# Patient Record
Sex: Male | Born: 1995 | Race: White | Hispanic: No | Marital: Single | Smoking: Never smoker
Health system: Southern US, Community
[De-identification: ages and names within clinical notes are randomized; demographics above are authoritative.]

## PROBLEM LIST (undated history)

## (undated) DIAGNOSIS — I341 Nonrheumatic mitral (valve) prolapse: Secondary | ICD-10-CM

## (undated) DIAGNOSIS — I340 Nonrheumatic mitral (valve) insufficiency: Secondary | ICD-10-CM

---
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
## 2015-02-15 ENCOUNTER — Emergency Department: Admit: 2015-02-15 | Discharge status: Against Medical Advice | Payer: Self-pay | Admitting: Emergency Medicine

## 2016-04-28 ENCOUNTER — Emergency Department
Admission: EM | Admit: 2016-04-28 | Discharge: 2016-04-28 | Disposition: A | Discharge status: Home / Self Care | Payer: Managed Care, Other (non HMO) | Attending: Emergency Medicine | Admitting: Emergency Medicine

## 2016-04-28 ENCOUNTER — Emergency Department: Payer: Managed Care, Other (non HMO)

## 2016-04-28 ENCOUNTER — Encounter: Payer: Self-pay | Admitting: Emergency Medicine

## 2016-04-28 DIAGNOSIS — R1084 Generalized abdominal pain: Secondary | ICD-10-CM | POA: Diagnosis present

## 2016-04-28 DIAGNOSIS — R1110 Vomiting, unspecified: Secondary | ICD-10-CM | POA: Insufficient documentation

## 2016-04-28 DIAGNOSIS — R197 Diarrhea, unspecified: Secondary | ICD-10-CM | POA: Insufficient documentation

## 2016-04-28 HISTORY — DX: Nonrheumatic mitral (valve) insufficiency: I34.0

## 2016-04-28 HISTORY — DX: Nonrheumatic mitral (valve) prolapse: I34.1

## 2016-04-28 LAB — COMPREHENSIVE METABOLIC PANEL
ALBUMIN: 4.2 g/dL (ref 3.5–5.0)
ALT: 30 U/L (ref 17–63)
ANION GAP: 6 (ref 5–15)
AST: 27 U/L (ref 15–41)
Alkaline Phosphatase: 86 U/L (ref 38–126)
BUN: 14 mg/dL (ref 6–20)
CALCIUM: 9 mg/dL (ref 8.9–10.3)
CO2: 26 mmol/L (ref 22–32)
Chloride: 106 mmol/L (ref 101–111)
Creatinine, Ser: 0.99 mg/dL (ref 0.61–1.24)
GFR calc non Af Amer: >60 mL/min (ref >60)
GLUCOSE: 87 mg/dL (ref 65–99)
POTASSIUM: 3.6 mmol/L (ref 3.5–5.1)
SODIUM: 138 mmol/L (ref 135–145)
Total Bilirubin: 0.5 mg/dL (ref 0.3–1.2)
Total Protein: 7.2 g/dL (ref 6.5–8.1)

## 2016-04-28 LAB — CBC
HEMATOCRIT: 42.6 % (ref 40.0–52.0)
HEMOGLOBIN: 15 g/dL (ref 13.0–18.0)
MCH: 30.2 pg (ref 26.0–34.0)
MCHC: 35.2 g/dL (ref 32.0–36.0)
MCV: 85.8 fL (ref 80.0–100.0)
PLATELETS: 243 10*3/uL (ref 150–440)
RBC: 4.96 MIL/uL (ref 4.40–5.90)
RDW: 13.5 % (ref 11.5–14.5)
WBC: 9.5 10*3/uL (ref 3.8–10.6)

## 2016-04-28 LAB — LIPASE, BLOOD: Lipase: 19 U/L (ref 11–51)

## 2016-04-28 MED ORDER — IOPAMIDOL (ISOVUE-300) INJECTION 61%
100.0000 mL | Freq: Once | INTRAVENOUS | Status: AC | PRN
  Administered 2016-04-28: 100 mL via INTRAVENOUS

## 2016-04-28 MED ORDER — ONDANSETRON HCL 4 MG PO TABS: 4.0000 mg | ORAL_TABLET | Freq: Every day | ORAL | Status: AC | PRN

## 2016-04-28 MED ORDER — DIATRIZOATE MEGLUMINE & SODIUM 66-10 % PO SOLN
15.0000 mL | Freq: Once | ORAL | Status: AC
  Administered 2016-04-28: 15 mL via ORAL

## 2016-04-28 NOTE — ED Notes (Signed)
Pt presents to ED with generalized abd cramping with frequent diarrhea for the past 4 days. Pt states the past 2 nights he has been incontinent of stool while sleeping. Pt states he has been staying at his girlfriends house and "they don't have an air conditioner" and pt is concerned that maybe he got too hot. Denies fever. States he thinks he "may be dehydrated".

## 2016-04-28 NOTE — Discharge Instructions (Signed)

## 2016-04-28 NOTE — ED Provider Notes (Signed)
[facility] Emergency Department Provider Note   ____________________________________________  Time seen: Approximately 608 AM  I have reviewed the triage vital signs and the nursing notes.   HISTORY  Chief Complaint Diarrhea and Abdominal Cramping    HPI Willie Matthews is a 20 y.o. male who comes into the hospital today with some vomiting, belly pain and diarrhea. The patient reports that he was at his girlfriend's house and thinks that that he got the best of him. He reports he has not been feeling the past 4 days. He reports that a few nights ago he had a bowel movement on himself while he was sleeping. He said that last night he took some precautions by not sleeping much and wrapping himself in a separate blanket that he had another episode where he had a bowel movement on himself. The patient reports that he's had some lower abdominal pain but also had some upper abdominal pain when examined previously. The patient also has had some vomiting. He reports that this all started 4 days ago. The patient vomited 3 times today and he reports it was yellow in color. His diarrhea has been watery. The patient denies any sick contacts and rates his pain a 4 out of 10 in intensity. The patient is here for evaluation.   Past Medical History  Diagnosis Date  . Mitral valve prolapse   . Mitral regurgitation     There are no active problems to display for this patient.   History reviewed. No pertinent past surgical history.  No current outpatient prescriptions on file.  Allergies Review of patient's allergies indicates no known allergies.  Family History  Problem Relation Age of Onset  . Diabetes Paternal Grandmother     Social History Social History  Substance Use Topics  . Smoking status: Never Smoker   . Smokeless tobacco: Never Used  . Alcohol Use: No    Review of Systems Constitutional: No fever/chills Eyes: No visual changes. ENT: No sore  throat. Cardiovascular: Denies chest pain. Respiratory: Denies shortness of breath. Gastrointestinal: abdominal pain.   nausea,  vomiting.   diarrhea.  No constipation. Genitourinary: Negative for dysuria. Musculoskeletal: Negative for back pain. Skin: Negative for rash. Neurological: Negative for headaches, focal weakness or numbness.  10-point ROS otherwise negative.  ____________________________________________   PHYSICAL EXAM:  VITAL SIGNS: ED Triage Vitals  Enc Vitals Group     BP 04/28/16 0316 129/66 mmHg     Pulse Rate 04/28/16 0316 69     Resp 04/28/16 0316 18     Temp 04/28/16 0316 97.6 F (36.4 C)     Temp Source 04/28/16 0316 Oral     SpO2 04/28/16 0316 97 %     Weight 04/28/16 0316 146 lb (66.225 kg)     Height 04/28/16 0316  (1.651 m)     Head Cir --      Peak Flow --      Pain Score 04/28/16 0317 6     Pain Loc --      Pain Edu? --      Excl. in GC? --     Constitutional: Alert and oriented. Well appearing and in No distress. Eyes: Conjunctivae are normal. PERRL. EOMI. Head: Atraumatic. Nose: No congestion/rhinnorhea. Mouth/Throat: Mucous membranes are moist.  Oropharynx non-erythematous. Cardiovascular: Normal rate, regular rhythm. Grossly normal heart sounds.  Good peripheral circulation. Respiratory: Normal respiratory effort.  No retractions. Lungs CTAB. Gastrointestinal: Soft with some diffuse abdominal tenderness to palpation. No  distention. Positive bowel sounds Musculoskeletal: No lower extremity tenderness nor edema.   Neurologic:  Normal speech and language.  Skin:  Skin is warm, dry and intact.  Psychiatric: Mood and affect are normal.   ____________________________________________   LABS (all labs ordered are listed, but only abnormal results are displayed)  Labs Reviewed  CBC  COMPREHENSIVE METABOLIC PANEL  URINALYSIS COMPLETEWITH MICROSCOPIC ([facility] ONLY)  LIPASE, BLOOD    ____________________________________________  EKG  None ____________________________________________  RADIOLOGY  CT abd and pelvis pending  ____________________________________________   PROCEDURES  Procedure(s) performed: None  Procedures  Critical Care performed: No  ____________________________________________   INITIAL IMPRESSION / ASSESSMENT AND PLAN / ED COURSE  Pertinent labs & imaging results that were available during my care of the patient were reviewed by me and considered in my medical decision making (see chart for details).  This is a 20 year old male who comes into the hospital today with some episodes of encopresis as well as some vomiting and abdominal pain. The patient is also been having diarrhea. The patient is concerned that he's had these episodes and he decided to come into the hospital to get checked out. He is concerned about dehydration. He will receive a CT scan as well as some normal saline for hydration.  The patient's blood work and imaging are pending at this time. His care will be signed out to Dr. Cyril LoosenKinner who will follow up the results of the blood work and imaging.  ____________________________________________   FINAL CLINICAL IMPRESSION(S) / ED DIAGNOSES  Final diagnoses:  Vomiting and diarrhea  Generalized abdominal pain      NEW MEDICATIONS STARTED DURING THIS VISIT:  New Prescriptions   No medications on file     Note:  This document was prepared using Dragon voice recognition software and may include unintentional dictation errors.    Rebecka ApleyAllison P Webster, MD 04/28/16 [phone]

## 2018-03-18 IMAGING — CT CT ABD-PELV W/ CM
2 of 4 series · 16 of 46 positions shown, 18 images · IV contrast (iopamidol)
Comparison: None.

CLINICAL DATA: Generalized abdominal cramping and diarrhea over the
last 4 days.

EXAM:
CT ABDOMEN AND PELVIS WITH CONTRAST
TECHNIQUE: Multidetector CT imaging of the abdomen and pelvis was performed
using the standard protocol following bolus administration of
intravenous contrast.
CONTRAST:  100mL [id number] IOPAMIDOL ([id number]) INJECTION 61%

[Series 2: routine abd pel with · axial · 0.60mm/px · z∈[-917,-507]mm · 13 of 90 slices shown, 15 images]
[im 4/90  soft-tissue]
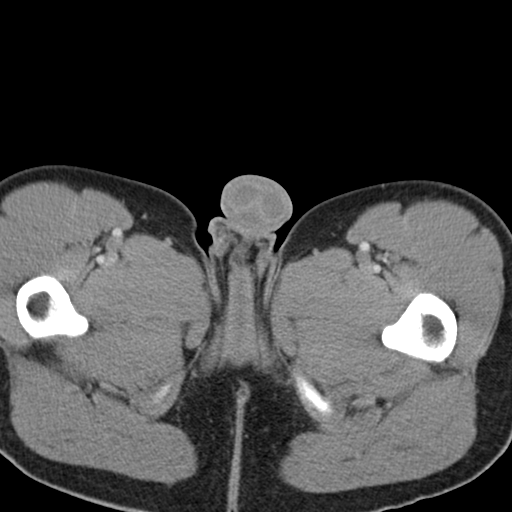
[im 4/90  bone]
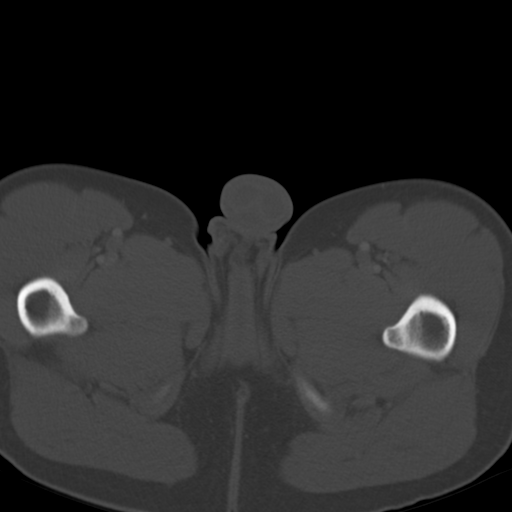
[im 11/90  soft-tissue]
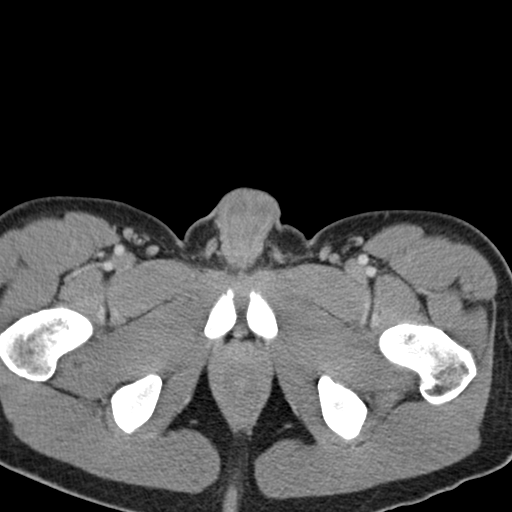
[im 18/90  soft-tissue]
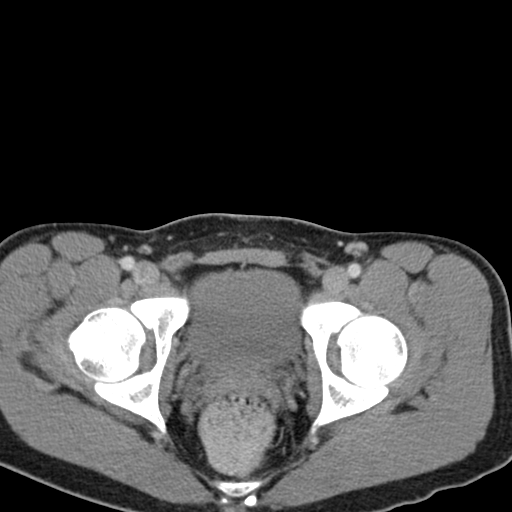
[im 25/90  soft-tissue]
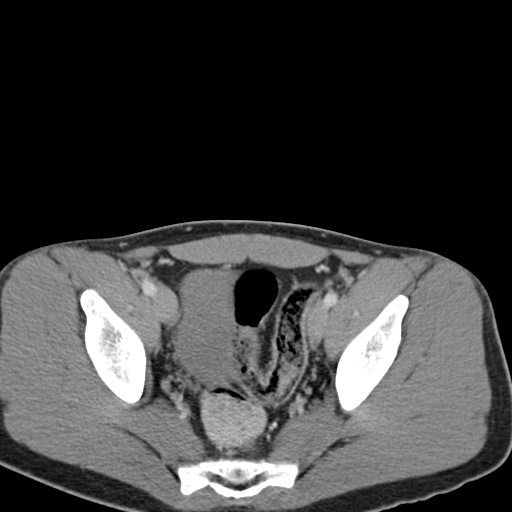
[im 33/90  soft-tissue]
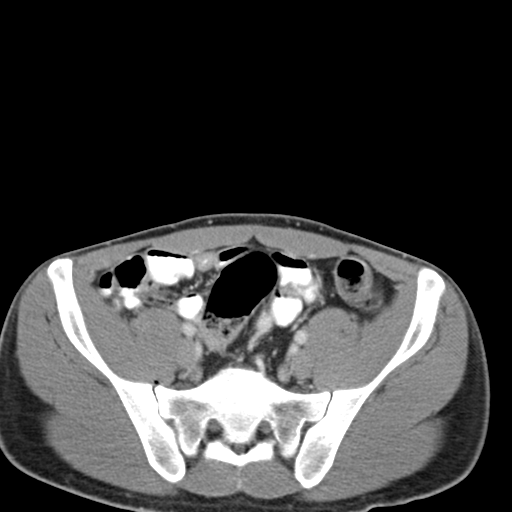
[im 40/90  soft-tissue]
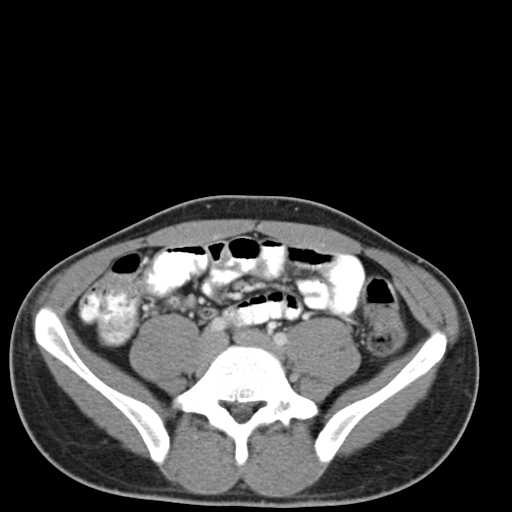
[im 47/90  soft-tissue]
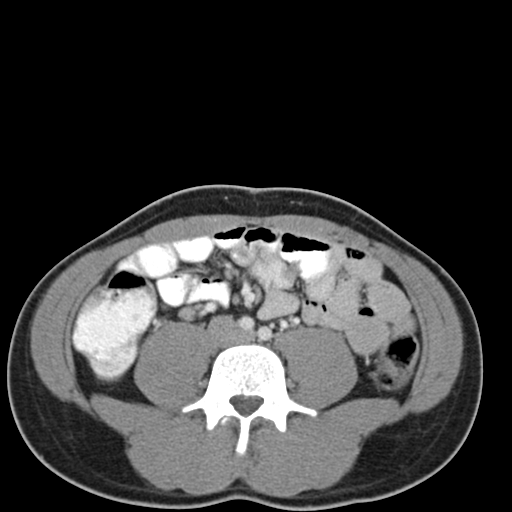
[im 50/90  soft-tissue]
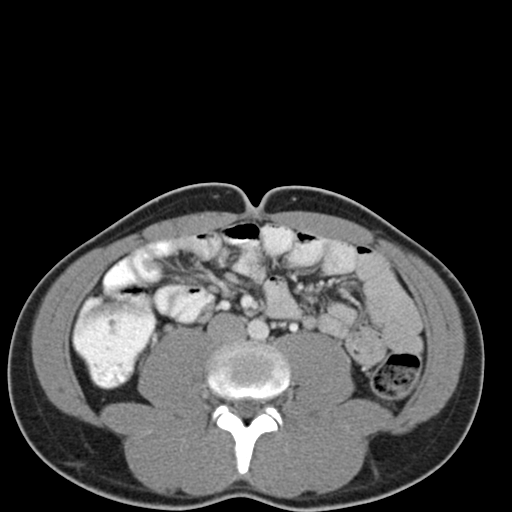
[im 57/90  soft-tissue]
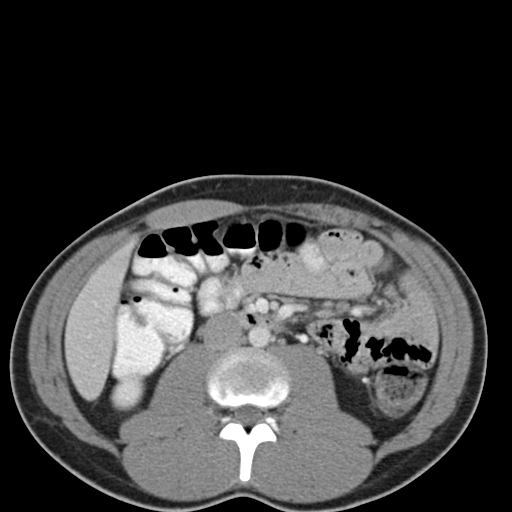
[im 57/90  bone]
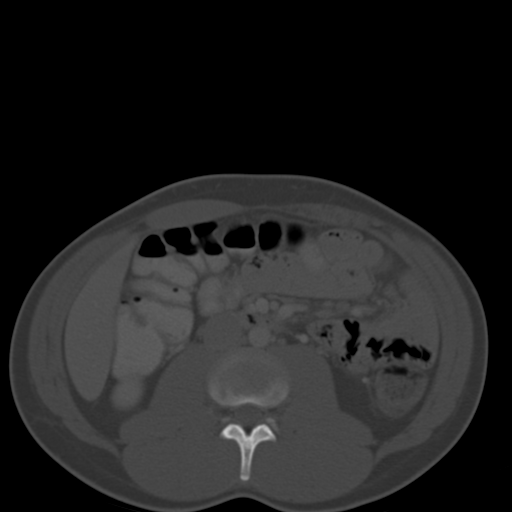
[im 65/90  soft-tissue]
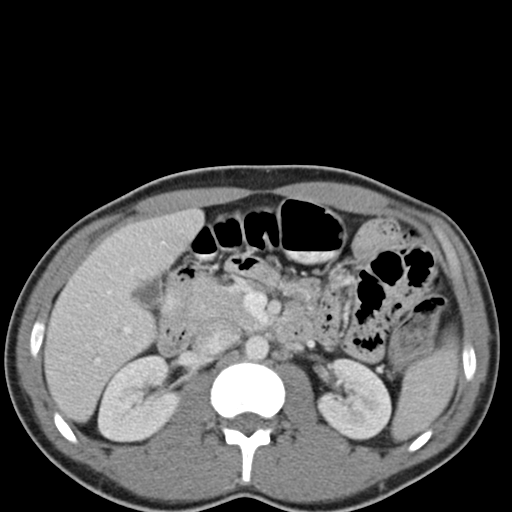
[im 72/90  soft-tissue]
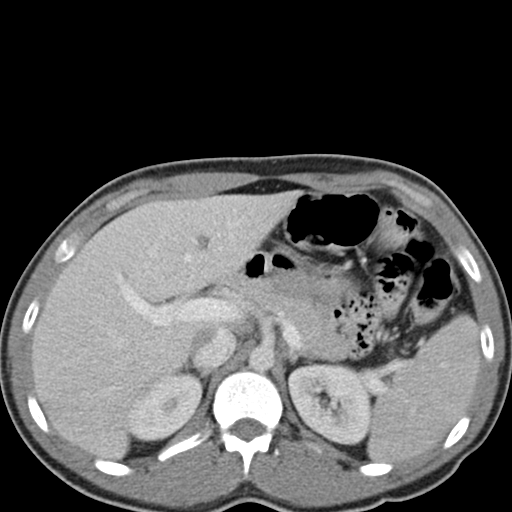
[im 79/90  soft-tissue]
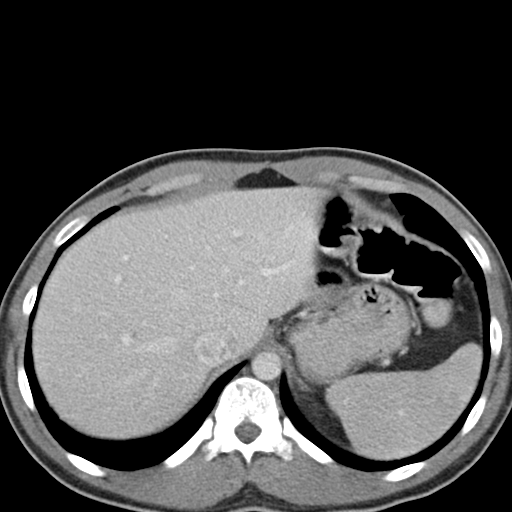
[im 86/90  soft-tissue]
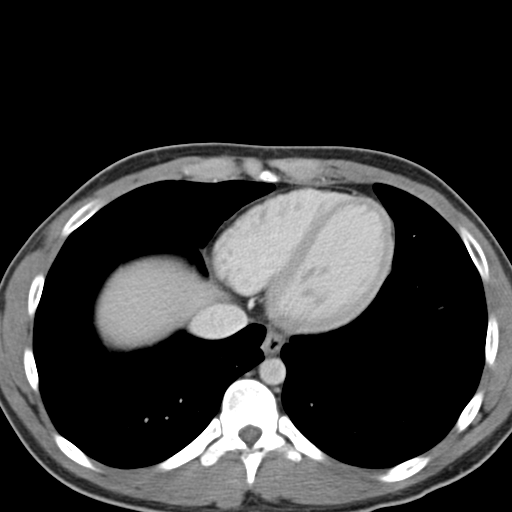

[Series 5: cor routine abd pel with · coronal · 0.60mm/px · 3 of 106 slices shown]
[im 36/106  soft-tissue]
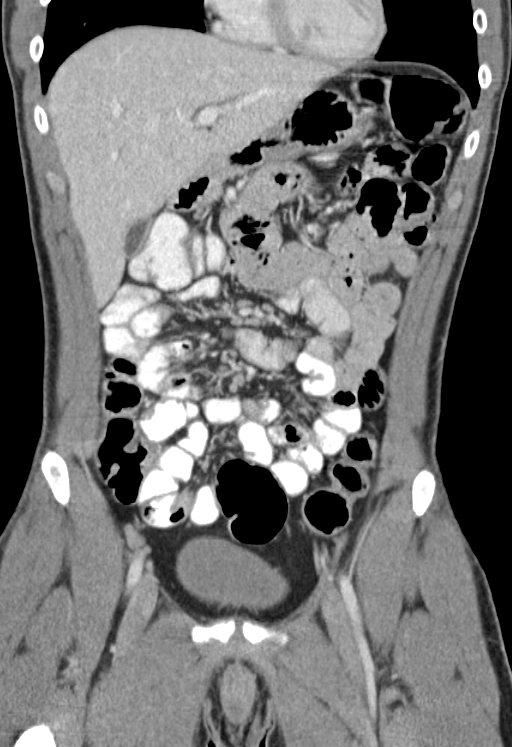
[im 47/106  soft-tissue]
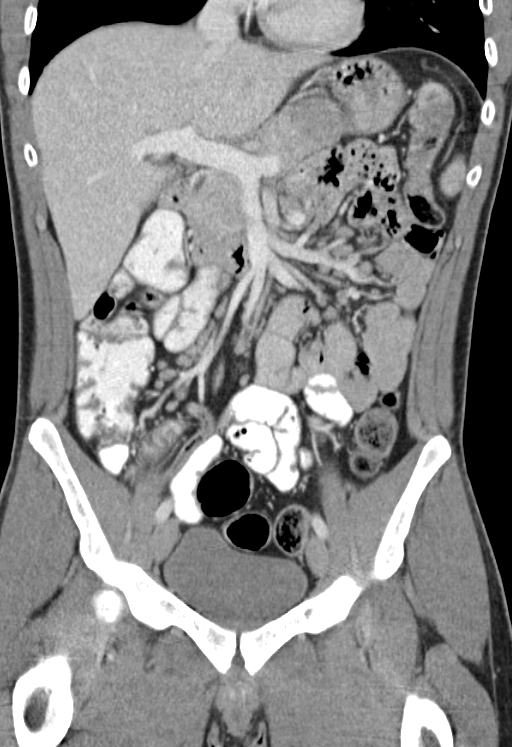
[im 59/106  soft-tissue]
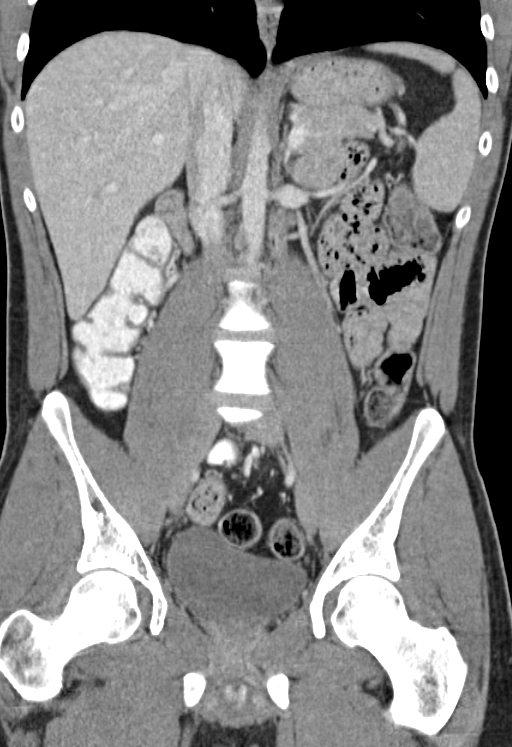

[16 of 46 positions shown; findings below may reference images not displayed]

FINDINGS: Lung bases are clear. No pleural or pericardial fluid. The liver,
gallbladder, spleen, pancreas, adrenal glands and kidneys are
normal. The aorta and IVC are normal. No free intraperitoneal fluid
or air. No bowel pathology. No evidence of enteritis. No evidence of
obstruction. The appendix is normal. No bone abnormality.
IMPRESSION: Normal examination. No abnormality seen to explain the presenting
symptoms.
